# Patient Record
Sex: Female | Born: 1935 | Race: White | Hispanic: No
Health system: Southern US, Community
[De-identification: ages and names within clinical notes are randomized; demographics above are authoritative.]

---

## 2005-08-23 ENCOUNTER — Ambulatory Visit: Payer: Self-pay | Admitting: Unknown Physician Specialty

## 2005-10-10 ENCOUNTER — Ambulatory Visit: Payer: Self-pay | Admitting: Family Medicine

## 2005-12-06 ENCOUNTER — Ambulatory Visit: Payer: Self-pay | Admitting: Vascular Surgery

## 2006-02-04 ENCOUNTER — Ambulatory Visit: Payer: Self-pay | Admitting: Family Medicine

## 2006-03-25 ENCOUNTER — Ambulatory Visit: Payer: Self-pay | Admitting: Vascular Surgery

## 2007-02-11 ENCOUNTER — Ambulatory Visit: Payer: Self-pay | Admitting: Family Medicine

## 2007-02-24 ENCOUNTER — Ambulatory Visit: Payer: Self-pay | Admitting: Family Medicine

## 2007-08-27 ENCOUNTER — Ambulatory Visit: Payer: Self-pay | Admitting: Family Medicine

## 2007-08-31 ENCOUNTER — Ambulatory Visit: Payer: Self-pay | Admitting: Family Medicine

## 2008-01-16 ENCOUNTER — Emergency Department: Payer: Self-pay | Admitting: Emergency Medicine

## 2008-06-07 ENCOUNTER — Ambulatory Visit: Payer: Self-pay | Admitting: Family Medicine

## 2008-12-29 ENCOUNTER — Ambulatory Visit: Payer: Self-pay | Admitting: Gastroenterology

## 2009-01-26 ENCOUNTER — Ambulatory Visit: Payer: Self-pay | Admitting: Unknown Physician Specialty

## 2009-06-13 ENCOUNTER — Ambulatory Visit: Payer: Self-pay | Admitting: Family Medicine

## 2010-06-15 ENCOUNTER — Ambulatory Visit: Payer: Self-pay | Admitting: Unknown Physician Specialty

## 2011-08-29 ENCOUNTER — Ambulatory Visit: Payer: Self-pay | Admitting: Family Medicine

## 2011-12-11 ENCOUNTER — Ambulatory Visit: Payer: Self-pay | Admitting: Ophthalmology

## 2011-12-25 ENCOUNTER — Ambulatory Visit: Payer: Self-pay | Admitting: Ophthalmology

## 2012-01-07 ENCOUNTER — Ambulatory Visit: Payer: Self-pay | Admitting: Unknown Physician Specialty

## 2012-10-12 ENCOUNTER — Ambulatory Visit: Payer: Self-pay | Admitting: Physician Assistant

## 2012-10-13 ENCOUNTER — Ambulatory Visit: Payer: Self-pay | Admitting: Physician Assistant

## 2012-11-10 ENCOUNTER — Ambulatory Visit: Payer: Self-pay | Admitting: Ophthalmology

## 2013-02-22 ENCOUNTER — Ambulatory Visit: Payer: Self-pay | Admitting: Unknown Physician Specialty

## 2013-04-13 ENCOUNTER — Ambulatory Visit: Payer: Self-pay | Admitting: Physician Assistant

## 2013-12-16 ENCOUNTER — Inpatient Hospital Stay: Payer: Self-pay | Admitting: Internal Medicine

## 2013-12-16 LAB — COMPREHENSIVE METABOLIC PANEL
Albumin: 2.8 g/dL — ABNORMAL LOW (ref 3.4–5.0)
Alkaline Phosphatase: 128 U/L — ABNORMAL HIGH
Anion Gap: 9 (ref 7–16)
BUN: 9 mg/dL (ref 7–18)
Bilirubin,Total: 0.7 mg/dL (ref 0.2–1.0)
CHLORIDE: 80 mmol/L — AB (ref 98–107)
Calcium, Total: 8.7 mg/dL (ref 8.5–10.1)
Co2: 25 mmol/L (ref 21–32)
Creatinine: 0.56 mg/dL — ABNORMAL LOW (ref 0.60–1.30)
EGFR (African American): >60
EGFR (Non-African Amer.): >60
Glucose: 118 mg/dL — ABNORMAL HIGH (ref 65–99)
Osmolality: 231 (ref 275–301)
Potassium: 4.3 mmol/L (ref 3.5–5.1)
SGOT(AST): 46 U/L — ABNORMAL HIGH (ref 15–37)
SGPT (ALT): 28 U/L (ref 12–78)
SODIUM: 114 mmol/L — AB (ref 136–145)
TOTAL PROTEIN: 6.2 g/dL — AB (ref 6.4–8.2)

## 2013-12-16 LAB — CBC
HCT: 37.9 % (ref 35.0–47.0)
HGB: 12.8 g/dL (ref 12.0–16.0)
MCH: 29.9 pg (ref 26.0–34.0)
MCHC: 33.9 g/dL (ref 32.0–36.0)
MCV: 88 fL (ref 80–100)
PLATELETS: 316 10*3/uL (ref 150–440)
RBC: 4.29 10*6/uL (ref 3.80–5.20)
RDW: 12.8 % (ref 11.5–14.5)
WBC: 8.8 10*3/uL (ref 3.6–11.0)

## 2013-12-16 LAB — TROPONIN I

## 2013-12-17 LAB — BASIC METABOLIC PANEL
ANION GAP: 8 (ref 7–16)
BUN: 9 mg/dL (ref 7–18)
CALCIUM: 8.6 mg/dL (ref 8.5–10.1)
CO2: 24 mmol/L (ref 21–32)
Chloride: 82 mmol/L — ABNORMAL LOW (ref 98–107)
Creatinine: 0.73 mg/dL (ref 0.60–1.30)
EGFR (African American): >60
GLUCOSE: 91 mg/dL (ref 65–99)
OSMOLALITY: 229 (ref 275–301)
POTASSIUM: 4.2 mmol/L (ref 3.5–5.1)
Sodium: 114 mmol/L — CL (ref 136–145)

## 2013-12-17 LAB — PRO B NATRIURETIC PEPTIDE: B-TYPE NATIURETIC PEPTID: 732 pg/mL — AB (ref 0–450)

## 2013-12-17 LAB — SODIUM, URINE, RANDOM: Sodium, Urine Random: 26 mmol/L (ref 20–110)

## 2013-12-17 LAB — CK: CK, Total: 367 U/L — ABNORMAL HIGH

## 2013-12-17 LAB — URINALYSIS, COMPLETE
BILIRUBIN, UR: NEGATIVE
BLOOD: NEGATIVE
Bacteria: NONE SEEN
Glucose,UR: NEGATIVE mg/dL (ref 0–75)
Hyaline Cast: 4
NITRITE: POSITIVE
Ph: 5 (ref 4.5–8.0)
Protein: NEGATIVE
RBC,UR: 3 /HPF (ref 0–5)
SPECIFIC GRAVITY: 1.017 (ref 1.003–1.030)
WBC UR: 118 /HPF (ref 0–5)

## 2013-12-17 LAB — SODIUM
SODIUM: 115 mmol/L — AB (ref 136–145)
Sodium: 112 mmol/L — CL (ref 136–145)
Sodium: 113 mmol/L — CL (ref 136–145)
Sodium: 117 mmol/L — CL (ref 136–145)

## 2013-12-17 LAB — OSMOLALITY, URINE: Osmolality: 469 mOsm/kg

## 2013-12-17 LAB — TSH: THYROID STIMULATING HORM: 1.07 u[IU]/mL

## 2013-12-18 LAB — COMPREHENSIVE METABOLIC PANEL
ANION GAP: 5 — AB (ref 7–16)
Albumin: 2.4 g/dL — ABNORMAL LOW (ref 3.4–5.0)
Alkaline Phosphatase: 107 U/L
BILIRUBIN TOTAL: 0.4 mg/dL (ref 0.2–1.0)
BUN: 6 mg/dL — ABNORMAL LOW (ref 7–18)
CHLORIDE: 91 mmol/L — AB (ref 98–107)
Calcium, Total: 8.6 mg/dL (ref 8.5–10.1)
Co2: 26 mmol/L (ref 21–32)
Creatinine: 0.68 mg/dL (ref 0.60–1.30)
EGFR (African American): >60
GLUCOSE: 99 mg/dL (ref 65–99)
Osmolality: 244 (ref 275–301)
Potassium: 4 mmol/L (ref 3.5–5.1)
SGOT(AST): 42 U/L — ABNORMAL HIGH (ref 15–37)
SGPT (ALT): 23 U/L (ref 12–78)
Sodium: 122 mmol/L — ABNORMAL LOW (ref 136–145)
Total Protein: 5.5 g/dL — ABNORMAL LOW (ref 6.4–8.2)

## 2013-12-18 LAB — CBC WITH DIFFERENTIAL/PLATELET
BASOS ABS: 0 10*3/uL (ref 0.0–0.1)
Basophil %: 0.3 %
Eosinophil #: 0 10*3/uL (ref 0.0–0.7)
Eosinophil %: 0.5 %
HCT: 34.8 % — ABNORMAL LOW (ref 35.0–47.0)
HGB: 11.7 g/dL — ABNORMAL LOW (ref 12.0–16.0)
LYMPHS ABS: 0.4 10*3/uL — AB (ref 1.0–3.6)
Lymphocyte %: 8.6 %
MCH: 29.8 pg (ref 26.0–34.0)
MCHC: 33.7 g/dL (ref 32.0–36.0)
MCV: 89 fL (ref 80–100)
MONOS PCT: 8.9 %
Monocyte #: 0.4 x10 3/mm (ref 0.2–0.9)
NEUTROS ABS: 4 10*3/uL (ref 1.4–6.5)
Neutrophil %: 81.7 %
PLATELETS: 242 10*3/uL (ref 150–440)
RBC: 3.92 10*6/uL (ref 3.80–5.20)
RDW: 12.9 % (ref 11.5–14.5)
WBC: 4.9 10*3/uL (ref 3.6–11.0)

## 2013-12-18 LAB — SODIUM
Sodium: 119 mmol/L — CL (ref 136–145)
Sodium: 122 mmol/L — ABNORMAL LOW (ref 136–145)
Sodium: 126 mmol/L — ABNORMAL LOW (ref 136–145)

## 2013-12-19 LAB — SODIUM
Sodium: 125 mmol/L — ABNORMAL LOW (ref 136–145)
Sodium: 124 mmol/L — ABNORMAL LOW (ref 136–145)

## 2013-12-20 LAB — BASIC METABOLIC PANEL
ANION GAP: 9 (ref 7–16)
BUN: 12 mg/dL (ref 7–18)
CALCIUM: 9.2 mg/dL (ref 8.5–10.1)
CHLORIDE: 92 mmol/L — AB (ref 98–107)
CO2: 25 mmol/L (ref 21–32)
Creatinine: 0.55 mg/dL — ABNORMAL LOW (ref 0.60–1.30)
EGFR (African American): >60
EGFR (Non-African Amer.): >60
GLUCOSE: 111 mg/dL — AB (ref 65–99)
Osmolality: 254 (ref 275–301)
POTASSIUM: 3.8 mmol/L (ref 3.5–5.1)
SODIUM: 126 mmol/L — AB (ref 136–145)

## 2013-12-20 LAB — SODIUM: Sodium: 124 mmol/L — ABNORMAL LOW (ref 136–145)

## 2013-12-21 LAB — CULTURE, BLOOD (SINGLE)

## 2014-02-12 DEATH — deceased

## 2014-11-05 NOTE — Discharge Summary (Signed)
PATIENT NAME:  Jamie Villegas, Jamie Villegas MR#:  [id number] DATE OF BIRTH:  Apr 02, 1929  DATE OF ADMISSION:  12/16/2013 DATE OF TRANSFER:  12/20/2013   ADMITTING PHYSICIAN: Jamie Athensavid K. Hower, MD  TRANSFERRING PHYSICIAN: Jamie Baasadhika Fabian Walder, MD  TRANSF[facility]EPTING FACILITY[facility].   PRIMARY CARE PHYSICIAN: Jamie MinisterMiriam McLaughlin, PA-C  CONSULTATIONS IN THE [facility]:  1.  Orthopedic consultation by Dr. Rosita Villegas. 2.  Nephrology consultation by Dr. Mady HaagensenMunsoor Villegas.  3.  Cardiothoracic surgery consultation by Dr. Hulda Marinimothy Villegas.   DISCHARGE DIAGNOSES:  1.  Hyponatremia.  2.  Syndrome of inappropriate antidiuretic hormone secretion.  3.  Fall and right olecranon bone avulsion fracture. Supportive treatment recommended.  4.  Acute hypoxic respiratory failure.  5.  Right upper lobe and middle lobe pneumonia.  6.  History of lung cancer, status post thickening of the right pleura. Further evaluation needed.  7.  Hypertension.  8.  Hyperlipidemia.  9.  History of abdominal aortic aneurysm, status post repair.   DISCHARGE MEDICATIONS: Include:  1.  Aspirin 81 mg p.o. daily.  2.  Multivitamin 1 tablet p.o. daily.  3.  Tylenol 650 mg p.o. q.6 hours p.r.n.  4.  Senna Plus 2 tablets p.o. twice a day.  5.  Sertraline 50 mg p.o. daily.  6.  Ursodiol 300 mg p.o. b.i.d.  7.  Vitamin D3, 1000 international units p.o. daily.  8.  Toprol-XL 25 mg p.o. daily.  9.  Subcutaneous heparin 5000 international units q.8 hours.  10.  Lasix 20 mg p.o. daily.  11.  DuoNebs with albuterol and ipratropium 3 mL inhaled every 4 hours.  12.  Xanax 0.25 mg every 8 hours as needed for anxiety.  13.  Zofran 4 mg IV q.4 hours p.r.n. for nausea, vomiting.  14.  Sodium chloride 1 gram tablet orally b.i.d.  15.  Levaquin 750 mg p.o. q. 48 hours.  16.  Prednisone taper over 6 days.   DISCHARGE HOME OXYGEN: 3 liters.   DISCHARGE DIET: Low-sodium diet.   DISCHARGE ACTIVITY: As tolerated.    FOLLOWUP INSTRUCTIONS: The patient  [facility].   LABORATORY AND IMAGING STUDIES PRIOR TO DISCHARGE:  1.  Sodium 126, potassium 3.8, chloride 92, bicarbonate 25, BUN 12, creatinine 0.5, glucose of 111, and calcium of 9.2.  2.  Chest x-ray done on December 20, 2013, showing loculated pleural fluid and thickening with consolidation at right lung base evaluated on CT chest performed on 12/17/2013 favors superimposed pulmonary edema and small left pleural effusion.  3.  WBC 4.9, hemoglobin 11.7, hematocrit 34.8; platelet count is 242. 4.  Serum albumin is 2.4, AST is 42, ALT is 23, alkaline phosphatase is 107, and total bilirubin is 0.4.  5.  Her presenting sodium was 114.  6.  CT of the chest with contrast on December 17, 2013, showing enhancing pleural-based nodularity, both anterior and posterior, in the lower portion of right hemithorax concerning for tumor. Mesothelioma versus metastatic cancer from unknown source would be considerations. Moderate nonenhancing pleural fluid is noted throughout the lung, including the major fissure on the right. Interstitial lung prominence without evidence of definite pulmonary nodules or edema noted. Cardiomegaly and 3-vessel coronary artery calcification noted. Slight fullness of the right adrenal gland of unknown clinical significance noted. A small right hydropneumothorax is seen.   BRIEF Vil[facility] COURSE: Jamie Villegas is an 81 year old elderly Caucasian female with past medical history significant for hypertension, hyperlipidemia, lung cancer, status post right lower lobectomy and recent  biopsy done at [facility] by cardiothoracic surgeon, Jamie Villegas. The patient was discharged, was doing well at home until recently when she had a fall and right elbow pain and brought to the [facility]. The patient was noted to have small avulsion fracture of the olecranon process, but was also noted to have extremely low sodium of 114 and CT chest done for acute respiratory failure showing a  pneumonia and also pleural-base density and a small hydropneumothorax.  1.  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 However, initially it was thought to be dehydration. She was given gentle hydration. That did not improve her sodium, so she was started on 3% hypertonic saline. That did bring up her sodium to 122, at which point the saline was stopped after 2 days' administration. She was being seen by nephrology, and she was started on salt tablets and fluid restriction for the last 36 hours. Her sodium is hovering around 125 and 126 range at this time. Since her respiratory difficulty started with some pulmonary congestion noted on chest x-ray, Lasix also has been started to help the hyponatremia. If this trial would not work in the next 24 hours, plan is to start either conivaptan or tolvaptan. The patient's mental status is at baseline. She is alert, oriented, with occasional periods of confusion.  2.  Acute hypoxic respiratory failure: The patient is not on home oxygen. She said she was briefly on oxygen after her biopsy procedure about a month ago, but never been on home oxygen. Since coming to the [facility], she has been on 4 liters oxygen. A CT of the chest was performed in the Emergency Room using IV contrast, which showed a right-sided pneumonia and pleural-based mass. This mass could be old mass versus a new mass, which we are not sure because we do not have records to compare with from Jamie [facility].  She follows with an oncologist, Dr. Abbey Villegas, at [facility] and will be further followed upon. The findings have been explained to the daughter and was advised to follow up with oncology as an outpatient or inpatient. The patient was also seen by the cardiothoracic surgeon, Dr. Thelma Villegas, for her small hydropneumothorax, which was deemed to be secondary to her recent procedure and no acute process at this time. The patient was started on IV antibiotics  with Levaquin for her pneumonia at this time. CT angiogram was attempted to be done to rule out PE. However, they could not get a 20-gauge needle on 12/20/2013, so a V/Q scan is ordered and is pending at this time. The patient has been on subcutaneous heparin for DVT prophylaxis while in the [facility].  3.  Hypertension: Her home medication, metoprolol, is being continued.  4.  Fall and olecranon process mild avulsion fracture: Seen by oncology. No surgical treatment is recommended. No need for immobilization recommended.  5.  Constipation, which has been relieved by using laxatives at this time.   Because the patient's sodium  is still not improved and her respiratory status is not improving yet, the patient's daughter wants the patient to be transferred to [facility] because her physicians and oncologist are over there, so a transfer was requested and Dr. Williemae Natter has accepted the patient. The patient will be transferred to [facility] whenever bed is available.   DISCHARGE CONDITION: Guarded.   DISCHARGE DISPOSITION: [facility].   TIME SPENT ON DISCHARGE: 40 minutes.   ____________________________  Jamie Baas, MD rk:jcm D: 12/20/2013 17:27:19 ET T: 12/20/2013 18:49:18 ET JOB#: [id number]  cc: Jamie Baas, MD, <Dictator> Jamie Baas MD ELECTRONICALLY SIGNED 12/25/2013 14:33

## 2014-11-05 NOTE — Consult Note (Signed)
PATIENT NAME:  Jamie Villegas, Jamie Villegas MR#:  [id number] DATE OF BIRTH:  December 20, 1928  DATE OF CONSULTATION:  12/17/2013  REFERRING PHYSICIAN:   CONSULTING PHYSICIAN:  Zakaria Sedor E. Shonika Kolasinski, MD  REASON FOR CONSULTATION: Evaluate abnormal CT scan.   HISTORY OF PRESENT ILLNESS: I have personally seen and examined Jamie Villegas. I have discussed her care with Dr. Katharina Caperima Vaickute. I have independently reviewed her chest x-ray and CT scan.   Jamie Villegas was admitted to the hospital with the diagnosis of hypoxia and hyponatremia with possible SIADH. She has a recent history of having undergone a right thoracoscopic resection of a lung mass [facility] 1 month ago. She states that she was in the hospital 4 or 5 days, was discharged and had 1 subsequent follow-up with Dr. Ewing Schlein'Amico. The patient was not aware of the diagnosis, and I did speak with the daughter who knew that it was a malignant tumor, but did not know the cell type. This is only important because of the possible diagnosis of SIADH. In any event, she had previously undergone a prior thoracoscopic evaluation on the left side approximately 8 years ago. When she came into the hospital now she had significant shortness of breath and chest x-ray which showed a hydropneumothorax and an infiltrate in the right lower lobe. She was placed on antibiotics and underwent an appropriate evaluation for SIADH. She states that just prior to her hospital admission here, she fell while she was trying to stand. She does not complain of any other neurologic problems at this time. She had a CT scan performed of her head and spine which revealed no acute problems. Upon admission, her sodium was 114 and had decreased to 112 today.   PHYSICAL EXAMINATION: She is awake, alert, and appropriate. She is eating. She has a well healed surgical scar on the right lower hemithorax consistent with a thoracoscopic lobectomy or wedge resection. There is also a well healed surgical scar on the  left. Her lungs show bilateral rales. Her heart is regular. I did not appreciate any murmurs. Her neck was supple without thyromegaly or adenopathy.   IMPRESSION AND RECOMMENDATIONS: DIAGNOSTIC DATA: I have independently reviewed her chest x-ray. What looks to me like an apical hydropneumothorax is typical for postoperative upper lobe resections. I would not consider this any particular abnormality that needs to be addressed at this time. The CT scan does show an apical hydropneumothorax, which again is small in size and is not unexpected after surgery. 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 There is extensive pneumonic infiltrate, however, which is consistent with pneumonia and may be the potential cause of her syndrome of inappropriate antidiuretic hormone. In any event, I do not see anything on the CT scan to warrant any intervention from a surgical standpoint at this time. I would recommend that a repeat chest x-ray be performed over the next couple days to be certain that the apical area is stable in size. In addition, I would recommend that we obtain the records from [facility] to evaluate the potential cell type as this may have some implications regarding her syndrome of inappropriate antidiuretic hormone.  Thank you very much for allowing me to participate in her care today.  ____________________________ Sheppard Plumberimothy E. Thelma Bargeaks, MD teo:sb D: 12/17/2013 14:53:07 ET T: 12/17/2013 16:00:30 ET JOB#: [id number]  cc: Marcial Pacasimothy E. Thelma Bargeaks, MD, <Dictator> Jasmine DecemberIMOTHY E Malaya Cagley MD ELECTRONICALLY SIGNED 12/18/2013 19:57

## 2014-11-05 NOTE — Consult Note (Signed)
Brief Consult Note: Diagnosis: olecranon fracture.   Comments: should not require any immobilization or surgical treatment.  Electronic Signatures: Leitha SchullerMenz, Lashina Milles J (MD)  (Signed 05-Jun-15 14:57)  Authored: Brief Consult Note   Last Updated: 05-Jun-15 14:57 by Leitha SchullerMenz, Paysen Goza J (MD)

## 2014-11-05 NOTE — Consult Note (Signed)
PATIENT NAME:  Jamie Villegas, Jamie Villegas MR#:  [id number] DATE OF BIRTH:  August 15, 1928  DATE OF CONSULTATION:  12/18/2013  CONSULTING PHYSICIAN:  Leitha SchullerMichael J. Chrishawn Boley, MD  REASON FOR CONSULT: Right elbow fracture.   HISTORY OF PRESENT ILLNESS: The patient is an 81 year old admitted with hypoxia. She suffered a fall a few days ago, had a laceration to the right elbow. This was sutured and has an ulcer bandage over it, and this was left intact. She had x-ray at that time, and the x-ray showed a small bony avulsion. I was consulted for evaluation of this fracture.   PHYSICAL EXAMINATION: She is neurovascularly intact in the right upper extremity. Has a dressing over the right posterior elbow. She has no instability to the elbow exam. She has full extension and flexion, and she has full strength with resisted extension at the elbow.   IMPRESSION AND PLAN: Small avulsion fracture of the proximal ulna. It does not appear to involve the extensor mechanism. Avulsion injury to the elbow should be of no clinical consequence. I will want to see her back in about 10 days to remove sutures from the elbow. As long as she does not develop signs of infection, this should heal uneventfully.    ____________________________ Leitha SchullerMichael J. Conny Situ, MD mjm:jcm D: 12/18/2013 12:50:30 ET T: 12/18/2013 17:53:36 ET JOB#: [id number]  cc: Leitha SchullerMichael J. Teryn Boerema, MD, <Dictator> Leitha SchullerMICHAEL J Matraca Hunkins MD ELECTRONICALLY SIGNED 12/19/2013 7:30

## 2014-11-05 NOTE — H&P (Signed)
PATIENT NAME:  Jamie Villegas, Jamie Villegas MR#:  [id number] DATE OF BIRTH:  05-Aug-1928  DATE OF ADMISSION:  12/16/2013  REFERRING PHYSICIAN:  Dr. Kerman Passey.   PRIMARY CARE PHYSICIAN:  Paulita Cradle, PA-C.   CHIEF COMPLAINT:  Fall.   HISTORY OF PRESENT ILLNESS:  An 81 year old Caucasian female with past medical history of hypertension, hyperlipidemia, abdominal aortic aneurysm status post repair as well as lung cancer status post right lower lobectomy presenting after a mechanical fall.  She states mechanical fall, essentially she was trying to stand, missed a railing and then fell.  Denies any symptoms.  Denies any lightheadedness, chest pain, shortness of breath, or palpitations.  She does denote occipital head trauma.  Denies any loss of consciousness.  While in the Emergency Department noted to be orthostatic.   REVIEW OF SYSTEMS:  CONSTITUTIONAL:  Positive for generalized fatigue, weakness.  Denies any fevers, chills.  EYES:  Denied blurred vision, double vision, eye pain. EAR, NOSE, THROAT:  Denies tinnitus, ear pain, hearing loss. RESPIRATORY:  Positive for shortness of breath on exertion.  Denies any cough or wheeze.  CARDIOVASCULAR:  Denies chest pain, palpitations, edema.  GASTROINTESTINAL:  Denies nausea, vomiting, diarrhea, abdominal pain.  GENITOURINARY:  Denies dysuria, hematuria.  ENDOCRINE:  Denies nocturia or thyroid problems. HEMATOLOGY AND LYMPHATIC:  Denies easy bruising or bleeding.  SKIN:  Denies rash or lesion.  MUSCULOSKELETAL:  Denies pain in neck, back, shoulder, knees, hips or arthritic symptoms.  NEUROLOGIC:  Denies paralysis, paresthesias.  PSYCHIATRIC:  Denies anxiety or depressive symptoms.  Otherwise, full review of systems performed by me is negative.   PAST MEDICAL HISTORY:  Hypertension, hyperlipidemia, recent right lower lobectomy secondary to lung cancer, performed at [facility] as well as history of abdominal aortic aneurysm, status post repair.   SOCIAL HISTORY:   Remote tobacco use.  Denies any alcohol or drug use.  Fully independent for activities of daily living.   FAMILY HISTORY:  Denies any known cardiovascular or pulmonary disorders.   ALLERGIES:  No known drug allergies.   HOME MEDICATIONS:  Include acetaminophen 325 mg 2 tabs every six hours as needed for pain, aspirin 81 mg by mouth daily, oxycodone 5 mg by mouth q. 6 hours as needed for pain, sertraline 50 mg by mouth daily, ursodiol 300 mg 1 capsule by mouth twice daily, cinnamon 500 mg by mouth daily, Senna-S 50/8.6 mg 2 tabs by mouth twice daily, fish oil 1000 mg 2 capsules daily, multivitamin 1 tab daily, vitamin D3 1000 international units daily.   PHYSICAL EXAMINATION: VITAL SIGNS:  Temperature 97.8, heart rate 80, respirations 18, blood pressure 123/56, saturating 95% on supplemental O2.  Orthostatic vital signs, sitting blood pressure 123/56 with a heart rate of 84; standing blood pressure 85/50 with heart rate of 86.  Weight 88.5 kg, BMI 32.5.   GENERAL:  Weak-appearing Caucasian female, currently in no acute distress.  HEAD:  Normocephalic, atraumatic.  EYES:  Pupils equal, round, reactive to light.  Extraocular muscles intact.  No scleral icterus.   MOUTH:  Moist mucosal membranes.  Dentition intact.  No abscess noted.  EARS, NOSE, THROAT:  Clear without exudates.  No external lesions.   NECK:  Supple.  No thyromegaly.  No nodules.  No JVD.  PULMONARY:  Decreased breath sounds throughout the entire right lung field, right lower and right middle.  Coarse breath sounds with scattered rhonchi.  Good air entry bilaterally.  No use of accessory muscles.  Good respiratory effort.  CHEST:  Nontender to  palpation.  CARDIOVASCULAR:  S1, S2, regular rate and rhythm.  No murmurs, rubs or gallops.  No edema.  Pedal pulses 2+ bilaterally.  GASTROINTESTINAL:  Soft, nontender, nondistended.  No masses.  Positive bowel sounds.  No hepatosplenomegaly.  MUSCULOSKELETAL:  No swelling, clubbing, edema.   Range of motion full in all extremities. NEUROLOGIC:  Cranial nerves II through XII intact.  No gross focal neurological deficits.  Sensation intact.  Reflexes intact.  SKIN:  No ulceration, lesions, rash, cyanosis.  Skin warm, dry.  Turgor intact.  PSYCHIATRIC:  Mood and affect within normal limits.  The patient is awake, alert, oriented x 3.  Insight and judgment intact.   LABORATORY DATA:  Had CT head performed reveals no acute intracranial process.  CT spine performed revealed no acute process.  Chest x-ray performed reveals right basilar opacity concerning for pneumonia versus edema with associated pleural effusion as well as right upper lobe opacity concerning for possible pneumonia, probable right middle hydropneumothorax.  Remainder of laboratory data:  Sodium 114, potassium 4.3, chloride 80, bicarb 25, BUN 9, creatinine 0.56, glucose 118.  LFTs:  Total protein 6.2, albumin 2.8, bilirubin 0.7, alk phos 128, AST 46, ALT 28.  Troponin less than 0.02.  WBCs 8.8, hemoglobin 12.8, platelets 316.   ASSESSMENT AND PLAN:  An 81 year old Caucasian female with history of hypertension, hyperlipidemia as well as lung cancer status post right lobectomy presenting after a fall.  1.  Hyponatremia, suspect in relation to syndrome of inappropriate diuretic hormone from pulmonary source given recent lobectomy; however, we will check urine osmolality and sodium as well as repeat basic metabolic panel in the morning.  Received 2 liters of normal saline thus far in the Emergency Department.  We will decrease fluids to 40 mL an hour to correct 442.5 mEq sodium deficit to correct goal sodium to 124 in the first 24 hours, total sodium deficit is 1150 mEq to correct to 140.  2.  Hypoxia, suspected atelectasis versus pneumonia.  Provide DuoNeb treatments, supplemental O2 to keep oxygen saturation greater than 92%, incentive spirometry as well as Levaquin.  3.  Depression.  Continue Zoloft. 4.  Venous thromboembolism  prophylaxis with heparin subQ.  5.  CODE STATUS:  THE PATIENT IS A FULL CODE.   TIME SPENT:  45 minutes.   ____________________________ Aaron Mose. Hower, MD dkh:ea D: 12/16/2013 22:14:44 ET T: 12/16/2013 22:30:49 ET JOB#: [id number]  cc: Aaron Mose. Hower, MD, <Dictator> DAVID Woodfin Ganja MD ELECTRONICALLY SIGNED 12/17/2013 2:43

## 2015-03-22 IMAGING — NM NM LUNG SCAN
2 series · 16 of 16 positions shown · non-contrast
Comparison: None; correlation chest radiograph 12/20/2013

CLINICAL DATA: Hypoxia

EXAM:
NUCLEAR MEDICINE VENTILATION - PERFUSION LUNG SCAN
TECHNIQUE: Ventilation images were obtained in multiple projections using
inhaled aerosol technetium 99 M DTPA. Perfusion images were obtained
in multiple projections after intravenous injection of 9c-RRm MAA.
RADIOPHARMACEUTICALS:  38.9 mCi 9c-RRm DTPA aerosol and 4.5 mCi
9c-RRm MAA

[Series 1000: lung perfusion · 1.95mm/px · 4 acquisitions, 8 frames shown]
[im 1/4]
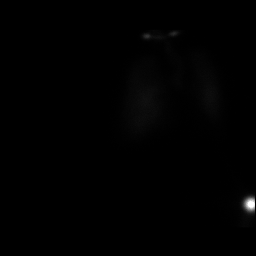
[im 1/4]
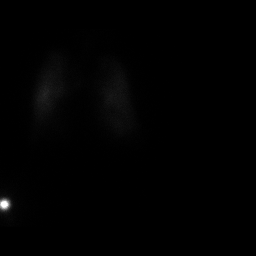
[im 2/4]
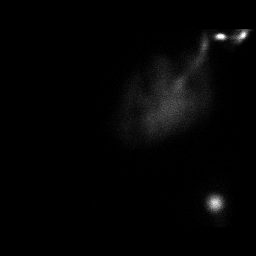
[im 2/4]
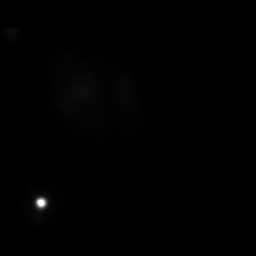
[im 3/4]
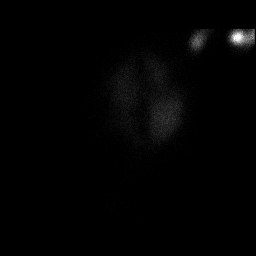
[im 3/4]
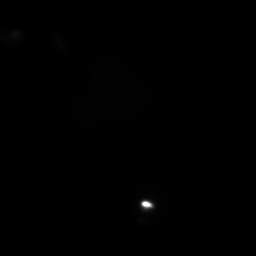
[im 4/4]
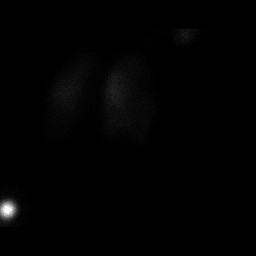
[im 4/4]
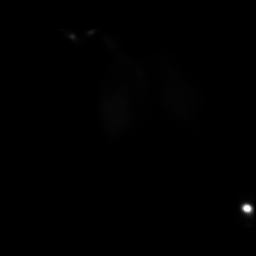

[Series 1000: lung ventilation · 3.90mm/px · 4 acquisitions, 8 frames shown]
[im 1/4]
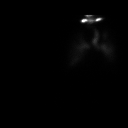
[im 1/4]
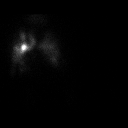
[im 2/4]
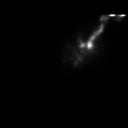
[im 2/4]
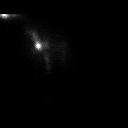
[im 3/4]
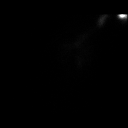
[im 3/4]
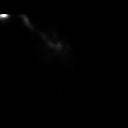
[im 4/4]
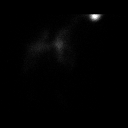
[im 4/4]
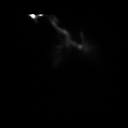

[16 of 16 positions shown; findings below may reference images not displayed]

FINDINGS: Ventilation: Suboptimal ventilation exam with significant tracer
deposition within the trachea and central bronchi. Poor aerosol
delivery to the periphery of both lungs. Ventilatory defects at LEFT
upper lobe and BILATERAL lower lobes.

Perfusion: Much better perfusion than ventilation is identified.
Photopenic defect lower LEFT lung from enlargement of cardiac
silhouette. Less significant diminished perfusion in RIGHT lower
lobe than ventilation. No focal perfusion defects identified within
the LEFT upper or LEFT lower lobes. Severely limited lateral views.

Chest radiograph: Enlargement of cardiac silhouette with small to
moderate RIGHT pleural effusion and extensive infiltrates in both
lower lobes greater on RIGHT, less in upper lobes.
IMPRESSION: Cardiomegaly with diminished perfusion in the lower lobes
bilaterally.

Ventilation exam severely limited by poor peripheral distribution of
aerosol within the lungs bilaterally.

Overall, exam demonstrates much better perfusion than ventilation in
both lungs favoring a parenchymal lung process.

Findings are most consistent with a low probability for pulmonary
embolism.
